# Patient Record
Sex: Male | Born: 2000 | Race: White | Hispanic: Yes | Marital: Single | Smoking: Never smoker
Health system: Southern US, Community
[De-identification: ages and names within clinical notes are randomized; demographics above are authoritative.]

## PROBLEM LIST (undated history)

## (undated) DIAGNOSIS — J45909 Unspecified asthma, uncomplicated: Secondary | ICD-10-CM

## (undated) HISTORY — DX: Unspecified asthma, uncomplicated: J45.909

## (undated) HISTORY — PX: ADENOIDECTOMY: SUR15

---
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
## 2021-04-21 IMAGING — CR DG CHEST 2V
2 series · 2 of 2 positions shown · non-contrast
Comparison: 06/23/2019

CLINICAL DATA: Chronic cough

EXAM:
CHEST - 2 VIEW

[w chest pa]
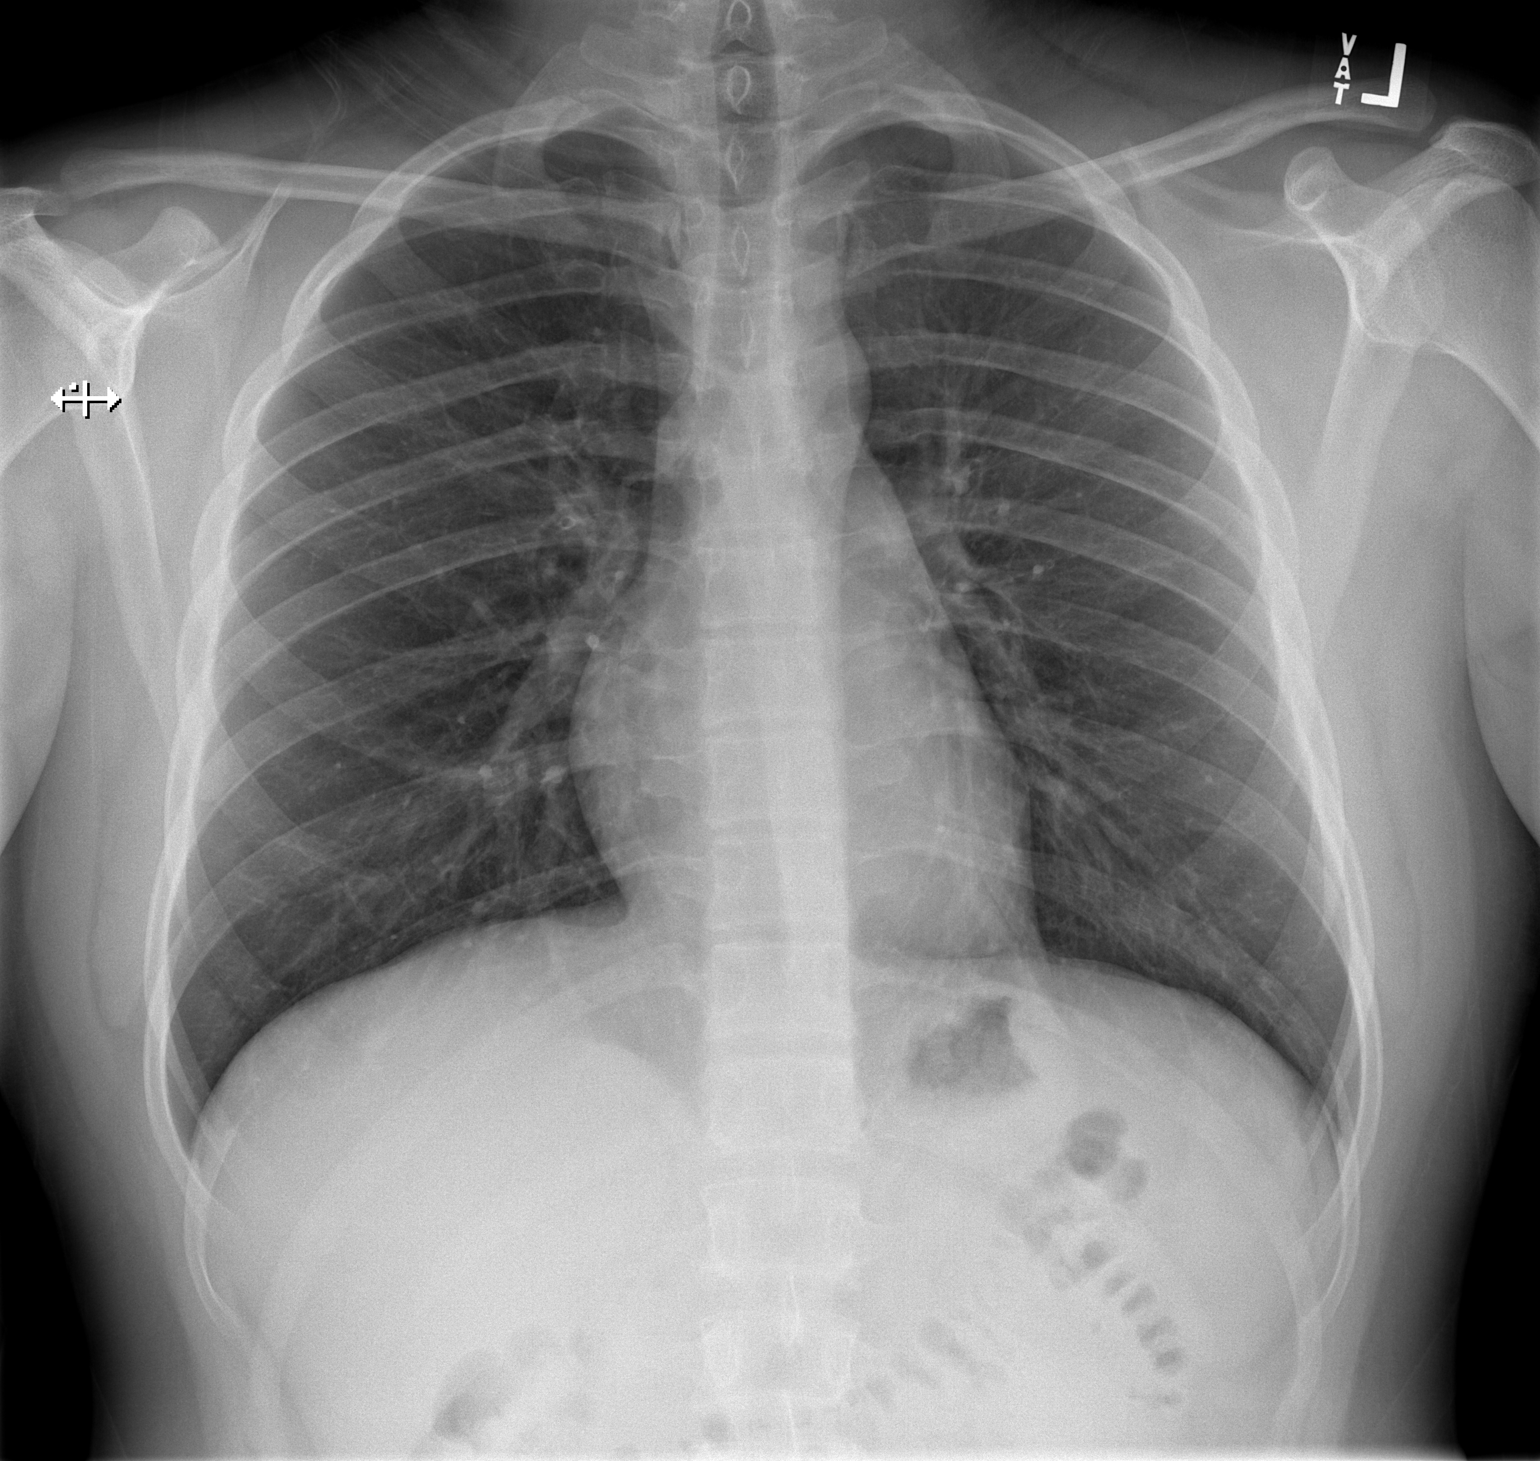

[w chest lat]
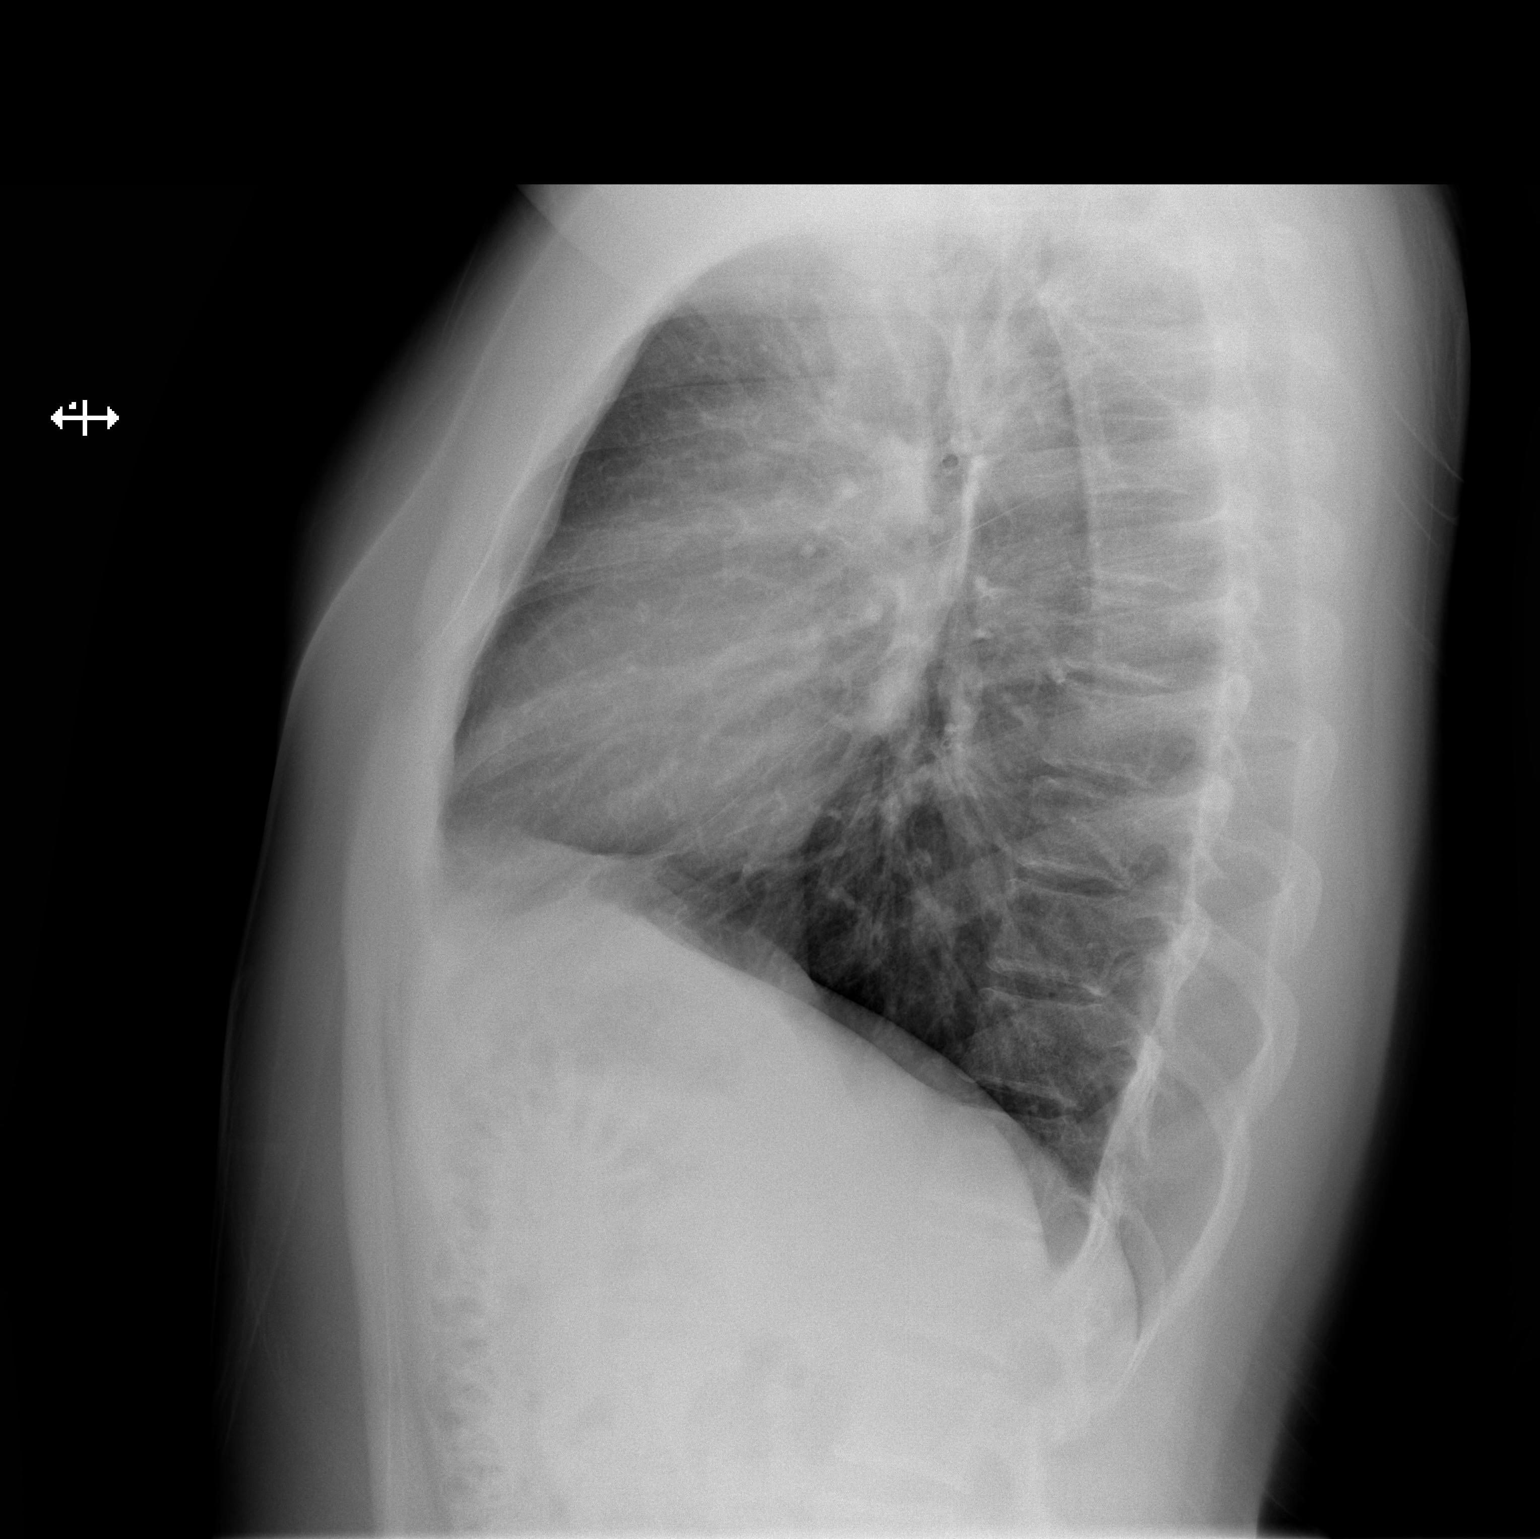

[2 of 2 positions shown; findings below may reference images not displayed]

FINDINGS: The heart size and mediastinal contours are within normal limits.
Both lungs are clear. The visualized skeletal structures are
unremarkable.
IMPRESSION: No active cardiopulmonary disease.

## 2021-07-16 DIAGNOSIS — F411 Generalized anxiety disorder: Secondary | ICD-10-CM | POA: Diagnosis not present

## 2021-07-16 DIAGNOSIS — M25572 Pain in left ankle and joints of left foot: Secondary | ICD-10-CM | POA: Diagnosis not present

## 2021-08-29 DIAGNOSIS — Z23 Encounter for immunization: Secondary | ICD-10-CM | POA: Diagnosis not present

## 2021-09-22 ENCOUNTER — Ambulatory Visit: Payer: Federal, State, Local not specified - PPO | Admitting: Behavioral Health
